# Patient Record
Sex: Female | Born: 1970 | Race: White | Hispanic: No | Marital: Married | Smoking: Never smoker
Health system: Southern US, Community
[De-identification: ages and names within clinical notes are randomized; demographics above are authoritative.]

## PROBLEM LIST (undated history)

## (undated) DIAGNOSIS — F329 Major depressive disorder, single episode, unspecified: Secondary | ICD-10-CM

## (undated) DIAGNOSIS — E039 Hypothyroidism, unspecified: Secondary | ICD-10-CM

## (undated) DIAGNOSIS — F32A Depression, unspecified: Secondary | ICD-10-CM

## (undated) DIAGNOSIS — O09529 Supervision of elderly multigravida, unspecified trimester: Secondary | ICD-10-CM

## (undated) DIAGNOSIS — Z8619 Personal history of other infectious and parasitic diseases: Secondary | ICD-10-CM

## (undated) HISTORY — DX: Depression, unspecified: F32.A

## (undated) HISTORY — DX: Personal history of other infectious and parasitic diseases: Z86.19

## (undated) HISTORY — PX: TONSILLECTOMY AND ADENOIDECTOMY: SUR1326

## (undated) HISTORY — DX: Supervision of elderly multigravida, unspecified trimester: O09.529

## (undated) HISTORY — DX: Major depressive disorder, single episode, unspecified: F32.9

## (undated) HISTORY — DX: Hypothyroidism, unspecified: E03.9

## (undated) HISTORY — PX: BREAST BIOPSY: SHX20

---

## 1997-09-29 ENCOUNTER — Inpatient Hospital Stay (HOSPITAL_COMMUNITY): Admission: AD | Admit: 1997-09-29 | Discharge: 1997-10-01 | Payer: Self-pay | Admitting: Obstetrics and Gynecology

## 1997-10-23 ENCOUNTER — Encounter: Admission: RE | Admit: 1997-10-23 | Discharge: 1998-01-21 | Payer: Self-pay | Admitting: *Deleted

## 1997-10-30 ENCOUNTER — Other Ambulatory Visit: Admission: RE | Admit: 1997-10-30 | Discharge: 1997-10-30 | Payer: Self-pay | Admitting: *Deleted

## 1998-11-04 ENCOUNTER — Other Ambulatory Visit: Admission: RE | Admit: 1998-11-04 | Discharge: 1998-11-04 | Payer: Self-pay | Admitting: *Deleted

## 1999-11-04 ENCOUNTER — Other Ambulatory Visit: Admission: RE | Admit: 1999-11-04 | Discharge: 1999-11-04 | Payer: Self-pay | Admitting: *Deleted

## 2000-11-14 ENCOUNTER — Other Ambulatory Visit: Admission: RE | Admit: 2000-11-14 | Discharge: 2000-11-14 | Payer: Self-pay | Admitting: *Deleted

## 2001-02-10 ENCOUNTER — Observation Stay (HOSPITAL_COMMUNITY): Admission: AD | Admit: 2001-02-10 | Discharge: 2001-02-11 | Payer: Self-pay | Admitting: *Deleted

## 2001-05-22 ENCOUNTER — Inpatient Hospital Stay: Admission: AD | Admit: 2001-05-22 | Discharge: 2001-05-22 | Payer: Self-pay | Admitting: Obstetrics and Gynecology

## 2001-05-25 ENCOUNTER — Inpatient Hospital Stay (HOSPITAL_COMMUNITY): Admission: AD | Admit: 2001-05-25 | Discharge: 2001-05-27 | Payer: Self-pay | Admitting: Obstetrics & Gynecology

## 2001-07-03 ENCOUNTER — Other Ambulatory Visit: Admission: RE | Admit: 2001-07-03 | Discharge: 2001-07-03 | Payer: Self-pay | Admitting: Obstetrics and Gynecology

## 2002-08-06 ENCOUNTER — Other Ambulatory Visit: Admission: RE | Admit: 2002-08-06 | Discharge: 2002-08-06 | Payer: Self-pay | Admitting: *Deleted

## 2003-09-17 ENCOUNTER — Other Ambulatory Visit: Admission: RE | Admit: 2003-09-17 | Discharge: 2003-09-17 | Payer: Self-pay | Admitting: *Deleted

## 2004-10-15 ENCOUNTER — Other Ambulatory Visit: Admission: RE | Admit: 2004-10-15 | Discharge: 2004-10-15 | Payer: Self-pay | Admitting: Obstetrics and Gynecology

## 2007-02-02 ENCOUNTER — Inpatient Hospital Stay (HOSPITAL_COMMUNITY): Admission: AD | Admit: 2007-02-02 | Discharge: 2007-02-02 | Payer: Self-pay | Admitting: Obstetrics and Gynecology

## 2007-02-10 ENCOUNTER — Inpatient Hospital Stay (HOSPITAL_COMMUNITY): Admission: RE | Admit: 2007-02-10 | Discharge: 2007-02-12 | Payer: Self-pay | Admitting: Obstetrics and Gynecology

## 2007-11-10 ENCOUNTER — Encounter: Payer: Self-pay | Admitting: Endocrinology

## 2007-11-29 ENCOUNTER — Ambulatory Visit: Payer: Self-pay | Admitting: Endocrinology

## 2007-11-29 DIAGNOSIS — E039 Hypothyroidism, unspecified: Secondary | ICD-10-CM | POA: Insufficient documentation

## 2007-12-28 ENCOUNTER — Encounter: Payer: Self-pay | Admitting: Endocrinology

## 2008-01-03 ENCOUNTER — Encounter: Payer: Self-pay | Admitting: Endocrinology

## 2008-02-16 ENCOUNTER — Ambulatory Visit: Payer: Self-pay | Admitting: Endocrinology

## 2008-11-28 ENCOUNTER — Ambulatory Visit: Payer: Self-pay | Admitting: Endocrinology

## 2008-12-27 ENCOUNTER — Telehealth: Payer: Self-pay | Admitting: Endocrinology

## 2009-02-27 ENCOUNTER — Telehealth: Payer: Self-pay | Admitting: Endocrinology

## 2010-03-12 ENCOUNTER — Inpatient Hospital Stay (HOSPITAL_COMMUNITY): Admission: RE | Admit: 2010-03-12 | Discharge: 2010-03-14 | Payer: Self-pay | Admitting: Obstetrics and Gynecology

## 2010-08-19 LAB — CBC
HCT: 29.5 % — ABNORMAL LOW (ref 36.0–46.0)
HCT: 35.3 % — ABNORMAL LOW (ref 36.0–46.0)
Hemoglobin: 10.1 g/dL — ABNORMAL LOW (ref 12.0–15.0)
MCH: 30.7 pg (ref 26.0–34.0)
MCHC: 34.2 g/dL (ref 30.0–36.0)
MCV: 89.7 fL (ref 78.0–100.0)
MCV: 89.9 fL (ref 78.0–100.0)
RBC: 3.28 MIL/uL — ABNORMAL LOW (ref 3.87–5.11)
RBC: 3.92 MIL/uL (ref 3.87–5.11)
WBC: 10.7 10*3/uL — ABNORMAL HIGH (ref 4.0–10.5)

## 2010-10-23 NOTE — Discharge Summary (Signed)
[facility]  Patient:    Megan Wagner, Megan Wagner Visit Number: [id number] MRN: [id number]          Service Type: OBS Location: [id number] Attending Physician:  Minette Headland Dictated by:   Freddy Finner, M.D. Admit Date:  02/10/2001 Discharge Date: 02/11/2001                             Discharge Summary  DISCHARGE DIAGNOSES:          1. Intrauterine pregnancy at [redacted] weeks gestation.                               2. Acute viral gastroenteritis with secondary                                  profound nausea and vomiting.  CONDITION ON DISCHARGE:       The patient is improved on the morning following admission after IV hydration, intravenous Zofran, and intravenous Reglan.  At this time she is tolerating a regular diet.  DISCHARGE INSTRUCTIONS:        She is to call for recurrence of symptoms.  She is to take adequate fluids.  FOLLOW-UP:                    She is to return to the office in approximately one week for follow-up.  HISTORY OF PRESENT ILLNESS:   For details, see that recorded in the admission note.  Briefly, the patient presented with sudden onset of nausea and vomiting, and this persisted in spite of antiemetics administered in the triage area.  PHYSICAL EXAMINATION:         Remarkable for the gravid state of the abdomen. No other physical abnormalities were noted.  LABORATORY DATA:              CBC with hemoglobin of 9.9, WBC of 12.0. Metabolic panel showed all parameters normal except albumin which was 2.8.  HOSPITAL COURSE:              Following admission the patient was continued on IV hydration and clear liquids only.  She was given Zofran every six hours and Reglan every six hours IV.  By the morning following her admission she had not had any emesis or feelings of nausea for approximately 12 hours.  She was able to tolerate a regular diet, and she was discharged home with disposition as noted above. Dictated by:   Freddy Finner, M.D. Attending Physician:  Minette Headland DD:  02/11/01 TD:  02/11/01 Job: [id number]

## 2011-03-19 LAB — CBC
HCT: 31.1 — ABNORMAL LOW
HCT: 34.1 — ABNORMAL LOW
Hemoglobin: 10.8 — ABNORMAL LOW
MCHC: 34.3
MCHC: 34.5
MCV: 88.1
Platelets: 220
Platelets: 229
RDW: 12.6
WBC: 11.6 — ABNORMAL HIGH

## 2011-06-08 NOTE — L&D Delivery Note (Signed)
Delivery Note  SVD viable female Apgars 9,9 over 1st degree lac.  Placenta delivered spontaneously intact with 3VC. Repair with 3-0 Chromic with good support and hemostasis noted and R/V exam confirms.  PH art was sent.  [place] cord blood was done.  Mother and baby were doing well.  EBL 300cc  [name], MD

## 2011-07-16 LAB — OB RESULTS CONSOLE HEPATITIS B SURFACE ANTIGEN: Hepatitis B Surface Ag: NEGATIVE

## 2011-07-16 LAB — OB RESULTS CONSOLE ABO/RH

## 2011-07-16 LAB — OB RESULTS CONSOLE ANTIBODY SCREEN: Antibody Screen: NEGATIVE

## 2011-07-16 LAB — OB RESULTS CONSOLE RUBELLA ANTIBODY, IGM: Rubella: IMMUNE

## 2011-07-16 LAB — OB RESULTS CONSOLE GC/CHLAMYDIA: Gonorrhea: NEGATIVE

## 2012-02-21 ENCOUNTER — Encounter (HOSPITAL_COMMUNITY): Payer: Self-pay | Admitting: *Deleted

## 2012-02-21 ENCOUNTER — Telehealth (HOSPITAL_COMMUNITY): Payer: Self-pay | Admitting: *Deleted

## 2012-02-21 NOTE — Telephone Encounter (Signed)
Preadmission screen  

## 2012-02-22 ENCOUNTER — Inpatient Hospital Stay (HOSPITAL_COMMUNITY): Payer: BC Managed Care – PPO | Admitting: Anesthesiology

## 2012-02-22 ENCOUNTER — Encounter (HOSPITAL_COMMUNITY): Payer: Self-pay

## 2012-02-22 ENCOUNTER — Encounter (HOSPITAL_COMMUNITY): Payer: Self-pay | Admitting: Anesthesiology

## 2012-02-22 ENCOUNTER — Inpatient Hospital Stay (HOSPITAL_COMMUNITY)
Admission: RE | Admit: 2012-02-22 | Discharge: 2012-02-24 | DRG: 373 | Disposition: A | Discharge status: Home / Self Care | Payer: BC Managed Care – PPO | Source: Ambulatory Visit | Attending: Obstetrics and Gynecology | Admitting: Obstetrics and Gynecology

## 2012-02-22 DIAGNOSIS — O09529 Supervision of elderly multigravida, unspecified trimester: Secondary | ICD-10-CM | POA: Diagnosis present

## 2012-02-22 DIAGNOSIS — E079 Disorder of thyroid, unspecified: Secondary | ICD-10-CM | POA: Diagnosis present

## 2012-02-22 DIAGNOSIS — Z22330 Carrier of Group B streptococcus: Secondary | ICD-10-CM

## 2012-02-22 DIAGNOSIS — O99284 Endocrine, nutritional and metabolic diseases complicating childbirth: Secondary | ICD-10-CM | POA: Diagnosis present

## 2012-02-22 DIAGNOSIS — E039 Hypothyroidism, unspecified: Secondary | ICD-10-CM | POA: Diagnosis present

## 2012-02-22 DIAGNOSIS — O99892 Other specified diseases and conditions complicating childbirth: Secondary | ICD-10-CM | POA: Diagnosis present

## 2012-02-22 LAB — CBC
MCH: 30.3 pg (ref 26.0–34.0)
MCHC: 33.7 g/dL (ref 30.0–36.0)
Platelets: 173 10*3/uL (ref 150–400)
RBC: 4.13 MIL/uL (ref 3.87–5.11)

## 2012-02-22 LAB — RPR: RPR Ser Ql: NONREACTIVE

## 2012-02-22 LAB — OB RESULTS CONSOLE GBS: GBS: NEGATIVE

## 2012-02-22 LAB — OB RESULTS CONSOLE GC/CHLAMYDIA: Gonorrhea: NEGATIVE

## 2012-02-22 MED ORDER — TERBUTALINE SULFATE 1 MG/ML IJ SOLN: 0.2500 mg | Freq: Once | INTRAMUSCULAR | Status: DC | PRN

## 2012-02-22 MED ORDER — PRENATAL MULTIVITAMIN CH
1.0000 | ORAL_TABLET | Freq: Every day | ORAL | Status: DC
  Administered 2012-02-23 – 2012-02-24 (×2): 1 via ORAL
  Filled 2012-02-22 (×2): qty 1

## 2012-02-22 MED ORDER — OXYCODONE-ACETAMINOPHEN 5-325 MG PO TABS: 1.0000 | ORAL_TABLET | ORAL | Status: DC | PRN

## 2012-02-22 MED ORDER — MEDROXYPROGESTERONE ACETATE 150 MG/ML IM SUSP: 150.0000 mg | INTRAMUSCULAR | Status: DC | PRN

## 2012-02-22 MED ORDER — ACETAMINOPHEN 325 MG PO TABS: 650.0000 mg | ORAL_TABLET | ORAL | Status: DC | PRN

## 2012-02-22 MED ORDER — LACTATED RINGERS IV SOLN
INTRAVENOUS | Status: DC
  Administered 2012-02-22: 15:00:00 via INTRAVENOUS

## 2012-02-22 MED ORDER — EPHEDRINE 5 MG/ML INJ
10.0000 mg | INTRAVENOUS | Status: DC | PRN
  Filled 2012-02-22: qty 2
  Filled 2012-02-22: qty 4

## 2012-02-22 MED ORDER — SODIUM CHLORIDE 0.9 % IJ SOLN: 3.0000 mL | INTRAMUSCULAR | Status: DC | PRN

## 2012-02-22 MED ORDER — DIPHENHYDRAMINE HCL 25 MG PO CAPS: 25.0000 mg | ORAL_CAPSULE | Freq: Four times a day (QID) | ORAL | Status: DC | PRN

## 2012-02-22 MED ORDER — ONDANSETRON HCL 4 MG PO TABS: 4.0000 mg | ORAL_TABLET | ORAL | Status: DC | PRN

## 2012-02-22 MED ORDER — LEVOTHYROXINE SODIUM 50 MCG PO TABS
50.0000 ug | ORAL_TABLET | Freq: Every day | ORAL | Status: DC
  Filled 2012-02-22 (×2): qty 1

## 2012-02-22 MED ORDER — PHENYLEPHRINE 40 MCG/ML (10ML) SYRINGE FOR IV PUSH (FOR BLOOD PRESSURE SUPPORT)
80.0000 ug | PREFILLED_SYRINGE | INTRAVENOUS | Status: DC | PRN
  Filled 2012-02-22: qty 2

## 2012-02-22 MED ORDER — IBUPROFEN 600 MG PO TABS: 600.0000 mg | ORAL_TABLET | Freq: Four times a day (QID) | ORAL | Status: DC | PRN

## 2012-02-22 MED ORDER — SIMETHICONE 80 MG PO CHEW: 80.0000 mg | CHEWABLE_TABLET | ORAL | Status: DC | PRN

## 2012-02-22 MED ORDER — WITCH HAZEL-GLYCERIN EX PADS: 1.0000 "application " | MEDICATED_PAD | CUTANEOUS | Status: DC | PRN

## 2012-02-22 MED ORDER — DIPHENHYDRAMINE HCL 50 MG/ML IJ SOLN: 12.5000 mg | INTRAMUSCULAR | Status: DC | PRN

## 2012-02-22 MED ORDER — CITRIC ACID-SODIUM CITRATE 334-500 MG/5ML PO SOLN
30.0000 mL | ORAL | Status: DC | PRN
  Administered 2012-02-22: 30 mL via ORAL
  Filled 2012-02-22: qty 15

## 2012-02-22 MED ORDER — ONDANSETRON HCL 4 MG/2ML IJ SOLN
4.0000 mg | Freq: Four times a day (QID) | INTRAMUSCULAR | Status: DC | PRN
  Administered 2012-02-22: 4 mg via INTRAVENOUS
  Filled 2012-02-22: qty 2

## 2012-02-22 MED ORDER — LACTATED RINGERS IV SOLN: 500.0000 mL | Freq: Once | INTRAVENOUS | Status: DC

## 2012-02-22 MED ORDER — SENNOSIDES-DOCUSATE SODIUM 8.6-50 MG PO TABS
2.0000 | ORAL_TABLET | Freq: Every day | ORAL | Status: DC
  Administered 2012-02-23: 2 via ORAL

## 2012-02-22 MED ORDER — SODIUM CHLORIDE 0.9 % IV SOLN
2.0000 g | Freq: Once | INTRAVENOUS | Status: AC
  Administered 2012-02-22: 2 g via INTRAVENOUS
  Filled 2012-02-22: qty 2000

## 2012-02-22 MED ORDER — IBUPROFEN 600 MG PO TABS
600.0000 mg | ORAL_TABLET | Freq: Four times a day (QID) | ORAL | Status: DC
  Administered 2012-02-23 – 2012-02-24 (×6): 600 mg via ORAL
  Filled 2012-02-22 (×6): qty 1

## 2012-02-22 MED ORDER — OXYTOCIN 40 UNITS IN LACTATED RINGERS INFUSION - SIMPLE MED
1.0000 m[IU]/min | INTRAVENOUS | Status: DC
  Administered 2012-02-22: 2 m[IU]/min via INTRAVENOUS

## 2012-02-22 MED ORDER — MEASLES, MUMPS & RUBELLA VAC ~~LOC~~ INJ
0.5000 mL | INJECTION | Freq: Once | SUBCUTANEOUS | Status: DC
  Filled 2012-02-22: qty 0.5

## 2012-02-22 MED ORDER — ZOLPIDEM TARTRATE 5 MG PO TABS: 5.0000 mg | ORAL_TABLET | Freq: Every evening | ORAL | Status: DC | PRN

## 2012-02-22 MED ORDER — BENZOCAINE-MENTHOL 20-0.5 % EX AERO
1.0000 "application " | INHALATION_SPRAY | CUTANEOUS | Status: DC | PRN
  Administered 2012-02-23: 1 via TOPICAL
  Filled 2012-02-22: qty 56

## 2012-02-22 MED ORDER — LANOLIN HYDROUS EX OINT: TOPICAL_OINTMENT | CUTANEOUS | Status: DC | PRN

## 2012-02-22 MED ORDER — LACTATED RINGERS IV SOLN: 500.0000 mL | INTRAVENOUS | Status: DC | PRN

## 2012-02-22 MED ORDER — ONDANSETRON HCL 4 MG/2ML IJ SOLN: 4.0000 mg | INTRAMUSCULAR | Status: DC | PRN

## 2012-02-22 MED ORDER — FENTANYL 2.5 MCG/ML BUPIVACAINE 1/10 % EPIDURAL INFUSION (WH - ANES)
INTRAMUSCULAR | Status: DC | PRN
  Administered 2012-02-22: 14 mL/h via EPIDURAL

## 2012-02-22 MED ORDER — EPHEDRINE 5 MG/ML INJ
10.0000 mg | INTRAVENOUS | Status: DC | PRN
  Filled 2012-02-22: qty 2

## 2012-02-22 MED ORDER — LIDOCAINE HCL (PF) 1 % IJ SOLN
30.0000 mL | INTRAMUSCULAR | Status: DC | PRN
  Filled 2012-02-22 (×2): qty 30

## 2012-02-22 MED ORDER — OXYTOCIN 40 UNITS IN LACTATED RINGERS INFUSION - SIMPLE MED
62.5000 mL/h | Freq: Once | INTRAVENOUS | Status: DC
  Filled 2012-02-22: qty 1000

## 2012-02-22 MED ORDER — FENTANYL 2.5 MCG/ML BUPIVACAINE 1/10 % EPIDURAL INFUSION (WH - ANES)
14.0000 mL/h | INTRAMUSCULAR | Status: DC
  Administered 2012-02-22: 14 mL/h via EPIDURAL
  Filled 2012-02-22 (×2): qty 60

## 2012-02-22 MED ORDER — SODIUM BICARBONATE 8.4 % IV SOLN
INTRAVENOUS | Status: DC | PRN
  Administered 2012-02-22: 4 mL via EPIDURAL

## 2012-02-22 MED ORDER — OXYTOCIN BOLUS FROM INFUSION
500.0000 mL | Freq: Once | INTRAVENOUS | Status: DC
  Filled 2012-02-22: qty 500

## 2012-02-22 MED ORDER — DIBUCAINE 1 % RE OINT: 1.0000 "application " | TOPICAL_OINTMENT | RECTAL | Status: DC | PRN

## 2012-02-22 MED ORDER — PHENYLEPHRINE 40 MCG/ML (10ML) SYRINGE FOR IV PUSH (FOR BLOOD PRESSURE SUPPORT)
80.0000 ug | PREFILLED_SYRINGE | INTRAVENOUS | Status: DC | PRN
  Filled 2012-02-22: qty 5
  Filled 2012-02-22: qty 2

## 2012-02-22 MED ORDER — SODIUM CHLORIDE 0.9 % IV SOLN
2.0000 g | Freq: Four times a day (QID) | INTRAVENOUS | Status: DC
  Administered 2012-02-22 (×2): 2 g via INTRAVENOUS
  Filled 2012-02-22 (×3): qty 2000

## 2012-02-22 MED ORDER — TETANUS-DIPHTH-ACELL PERTUSSIS 5-2.5-18.5 LF-MCG/0.5 IM SUSP: 0.5000 mL | Freq: Once | INTRAMUSCULAR | Status: DC

## 2012-02-22 NOTE — Anesthesia Procedure Notes (Signed)
Epidural Patient location during procedure: OB  Preanesthetic Checklist Completed: patient identified, site marked, surgical consent, pre-op evaluation, timeout performed, IV checked, risks and benefits discussed and monitors and equipment checked  Epidural Patient position: sitting Prep: site prepped and draped and DuraPrep Patient monitoring: continuous pulse ox and blood pressure Approach: midline Injection technique: LOR air  Needle:  Needle type: Tuohy  Needle gauge: 17 G Needle length: 9 cm and 9 Needle insertion depth: 5 cm cm Catheter type: closed end flexible Catheter size: 19 Gauge Catheter at skin depth: 10 cm Test dose: negative  Assessment Events: blood not aspirated, injection not painful, no injection resistance, negative IV test and no paresthesia  Additional Notes Dosing of Epidural:  1st dose, through needle ............................................. epi 1:200K + Xylocaine 40 mg  2nd dose, through catheter, after waiting 3 minutes.....epi 1:200K + Xylocaine 40 mg  3rd dose, through catheter after waiting 3 minutes .............................Marcaine   4mg   ( mg Marcaine are expressed as equivilent  cc's medication removed from the 0.1%Bupiv / fentanyl syringe from L&D pump)  ( 2% Xylo charted as a single dose in Epic Meds for ease of charting; actual dosing was fractionated as above, for saftey's sake)  As each dose occurred, patient was free of IV sx; and patient exhibited no evidence of SA injection.  Patient is more comfortable after epidural dosed. Please see RN's note for documentation of vital signs,and FHR which are stable.  Patient reminded not to try to ambulate with numb legs, and that an RN must be present the 1st time she attempts to get up.    

## 2012-02-22 NOTE — Anesthesia Preprocedure Evaluation (Signed)

## 2012-02-22 NOTE — H&P (Signed)
Megan Wagner is a 41 y.o. female presenting for induction of labor due to favorable cervix and GBS+. History OB History    Grav Para Term Preterm Abortions TAB SAB Ect Mult Living   5 4 4       4      Past Medical History  Diagnosis Date  . H/O varicella   . Hypothyroidism   . Depression     pp dep with 2nd pregnancy situational took zoloft  . AMA (advanced maternal age) multigravida 35+    Past Surgical History  Procedure Date  . Tonsillectomy and adenoidectomy    Family History: family history includes Birth defects in her cousins; Cancer in her father, paternal aunts, and paternal grandfather; Heart disease in her maternal grandfather and mother; and Osteoporosis in her maternal grandmother and mother. Social History:  reports that she has never smoked. She has never used smokeless tobacco. She reports that she does not drink alcohol or use illicit drugs.   Prenatal Transfer Tool  Maternal Diabetes: No Genetic Screening: Normal Maternal Ultrasounds/Referrals: Normal Fetal Ultrasounds or other Referrals:  None Maternal Substance Abuse:  No Significant Maternal Medications:  None Significant Maternal Lab Results:  None Other Comments:  None  ROS  Dilation: 2 Effacement (%): 70;80 Station: -2 Exam by:: Dr. Rana Snare Blood pressure 111/71, pulse 75, temperature 98.7 F (37.1 C), temperature source Oral, resp. rate 16, height 5\' 6"  (1.676 m), weight 71.668 kg (158 lb), last menstrual period 05/20/2011. Exam Physical Exam  Prenatal labs: ABO, Rh: O/Positive/-- (02/08 0000) Antibody: Negative (02/08 0000) Rubella: Immune (02/08 0000) RPR: Nonreactive (02/08 0000)  HBsAg: Negative (02/08 0000)  HIV: Non-reactive (02/08 0000)  GBS:     Assessment/Plan: IUP at term  Favorble cx and GBS+ Abx and AROM Anticipate SVD   Megan Wagner C 02/22/2012, 9:34 AM

## 2012-02-23 ENCOUNTER — Encounter (HOSPITAL_COMMUNITY): Payer: Self-pay

## 2012-02-23 LAB — CBC
Hemoglobin: 11.6 g/dL — ABNORMAL LOW (ref 12.0–15.0)
RBC: 3.82 MIL/uL — ABNORMAL LOW (ref 3.87–5.11)

## 2012-02-23 MED ORDER — INFLUENZA VIRUS VACC SPLIT PF IM SUSP
0.5000 mL | INTRAMUSCULAR | Status: AC
  Administered 2012-02-24: 0.5 mL via INTRAMUSCULAR
  Filled 2012-02-23: qty 0.5

## 2012-02-23 NOTE — Progress Notes (Signed)
Patient was referred for history of depression/anxiety. * Referral screened out by Clinical Social Worker because none of the following criteria appear to apply: ~ History of anxiety/depression during this pregnancy, or of post-partum depression. ~ Diagnosis of anxiety and/or depression within last 3 years ~ History of depression due to pregnancy loss/loss of child OR * Patient's symptoms currently being treated with medication and/or therapy. Please contact the Clinical Social Worker if needs arise, or if patient requests.  PNR states depression was situational with one past pregnancy and patient took Zoloft at the time.

## 2012-02-23 NOTE — Progress Notes (Signed)
Post Partum Day 1 Subjective: no complaints, up ad lib, voiding and tolerating PO  Objective: Blood pressure 91/50, pulse 55, temperature 98.1 F (36.7 C), temperature source Oral, resp. rate 16, height 5\' 6"  (1.676 m), weight 71.668 kg (158 lb), last menstrual period 05/20/2011, SpO2 100.00%, unknown if currently breastfeeding.  Physical Exam:  General: alert and cooperative Lochia: appropriate Uterine Fundus: firm Incision: perineum intact DVT Evaluation: No evidence of DVT seen on physical exam.   Basename 02/23/12 0515 02/22/12 0720  HGB 11.6* 12.5  HCT 34.6* 37.1    Assessment/Plan: Plan for discharge tomorrow   LOS: 1 day   Megan Wagner 02/23/2012, 8:08 AM

## 2012-02-24 ENCOUNTER — Inpatient Hospital Stay (HOSPITAL_COMMUNITY): Admission: AD | Admit: 2012-02-24 | Payer: Self-pay | Source: Ambulatory Visit | Admitting: Obstetrics and Gynecology

## 2012-02-24 MED ORDER — IBUPROFEN 600 MG PO TABS: 600.0000 mg | ORAL_TABLET | Freq: Four times a day (QID) | ORAL | Status: AC

## 2012-02-24 MED ORDER — INFLUENZA VIRUS VACC SPLIT PF IM SUSP: 0.5000 mL | INTRAMUSCULAR | Status: DC

## 2012-02-24 NOTE — Anesthesia Postprocedure Evaluation (Signed)
  Anesthesia Post-op Note  Patient: Megan Wagner  Procedure(s) Performed: * No procedures listed *  This patient has recovered from her labor epidural, and I am not aware of any complications or problems.

## 2012-02-24 NOTE — Discharge Summary (Signed)
Obstetric Discharge Summary Reason for Admission: induction of labor Prenatal Procedures: ultrasound Intrapartum Procedures: spontaneous vaginal delivery Postpartum Procedures: none Complications-Operative and Postpartum: 1 degree perineal laceration Hemoglobin  Date Value Range Status  02/23/2012 11.6* 12.0 - 15.0 g/dL Final     HCT  Date Value Range Status  02/23/2012 34.6* 36.0 - 46.0 % Final    Physical Exam:  General: alert and cooperative Lochia: appropriate Uterine Fundus: firm Incision: perineum intact DVT Evaluation: No evidence of DVT seen on physical exam.  Discharge Diagnoses: Term Pregnancy-delivered  Discharge Information: Date: 02/24/2012 Activity: pelvic rest Diet: routine Medications: PNV, Ibuprofen and synthroid Condition: stable Instructions: refer to practice specific booklet Discharge to: home   Newborn Data: Live born female  Birth Weight: 7 lb 4.6 oz (3306 g) APGAR: 9, 9  Home with mother.  Adylin Hankey G 02/24/2012, 8:07 AM

## 2012-03-29 ENCOUNTER — Other Ambulatory Visit: Payer: Self-pay | Admitting: Obstetrics and Gynecology

## 2012-03-29 DIAGNOSIS — N63 Unspecified lump in unspecified breast: Secondary | ICD-10-CM

## 2012-03-31 ENCOUNTER — Other Ambulatory Visit: Payer: BC Managed Care – PPO

## 2012-04-03 ENCOUNTER — Ambulatory Visit
Admission: RE | Admit: 2012-04-03 | Discharge: 2012-04-03 | Disposition: A | Discharge status: Home / Self Care | Payer: BC Managed Care – PPO | Source: Ambulatory Visit | Attending: Obstetrics and Gynecology | Admitting: Obstetrics and Gynecology

## 2012-04-03 DIAGNOSIS — N63 Unspecified lump in unspecified breast: Secondary | ICD-10-CM

## 2014-04-08 ENCOUNTER — Encounter (HOSPITAL_COMMUNITY): Payer: Self-pay

## 2014-08-15 ENCOUNTER — Institutional Professional Consult (permissible substitution): Payer: Self-pay | Admitting: Pulmonary Disease

## 2015-12-31 ENCOUNTER — Ambulatory Visit (INDEPENDENT_AMBULATORY_CARE_PROVIDER_SITE_OTHER): Payer: BLUE CROSS/BLUE SHIELD

## 2015-12-31 ENCOUNTER — Ambulatory Visit: Payer: Self-pay

## 2015-12-31 ENCOUNTER — Encounter: Payer: Self-pay | Admitting: Podiatry

## 2015-12-31 ENCOUNTER — Ambulatory Visit (INDEPENDENT_AMBULATORY_CARE_PROVIDER_SITE_OTHER): Payer: BLUE CROSS/BLUE SHIELD | Admitting: Podiatry

## 2015-12-31 VITALS — BP 120/79 | HR 61 | Resp 16 | Ht 65.0 in | Wt 140.0 lb

## 2015-12-31 DIAGNOSIS — M779 Enthesopathy, unspecified: Secondary | ICD-10-CM

## 2015-12-31 DIAGNOSIS — M79673 Pain in unspecified foot: Secondary | ICD-10-CM

## 2015-12-31 DIAGNOSIS — M2042 Other hammer toe(s) (acquired), left foot: Secondary | ICD-10-CM

## 2015-12-31 DIAGNOSIS — M21619 Bunion of unspecified foot: Secondary | ICD-10-CM

## 2015-12-31 MED ORDER — TRIAMCINOLONE ACETONIDE 10 MG/ML IJ SUSP
10.0000 mg | Freq: Once | INTRAMUSCULAR | Status: AC
  Administered 2015-12-31: 10 mg

## 2015-12-31 NOTE — Progress Notes (Signed)
   Subjective:    Patient ID: Megan Wagner, female    DOB: 05/05/1971, 45 y.o.   MRN: [id number]  HPI Chief Complaint  Patient presents with  . Foot Pain    Bilateral; plantar forefoot & dorsal (near toes); pt stated, "Ball of feet are killing them; feet feel bruised on the top of the foot"      Review of Systems  All other systems reviewed and are negative.      Objective:   Physical Exam        Assessment & Plan:

## 2016-01-01 NOTE — Progress Notes (Signed)
Subjective:     Patient ID: Megan Wagner, female   DOB: 01-09-1971, 45 y.o.   MRN: [id number]  HPI patient states that she's developed a lot of forefoot pain left over right over the last approximate 7 month. She states that she has increased her activity and has been walking more during that time and the pain seems to be worse and she had had a diagnosis of neuroma from a while ago   Review of Systems  All other systems reviewed and are negative.      Objective:   Physical Exam  Constitutional: She is oriented to person, place, and time.  Cardiovascular: Intact distal pulses.   Musculoskeletal: Normal range of motion.  Neurological: She is oriented to person, place, and time.  Skin: Skin is warm.  Nursing note and vitals reviewed.  neurovascular status found to be intact with muscle strength adequate range of motion within normal limits. Mild elevation of the lesser digits left over right with inflammation and pain around the second metatarsophalangeal joint left over right with also what appears to be palpable neuroma left foot. Patient is found to have good digital perfusion is well oriented and appears to have mild depression of the arch     Assessment:     Inflammatory changes with probable capsulitis second MPJ left over right with possibility also for neuroma symptomatology    Plan:     H&P x-rays reviewed and today I discussed did a proximal block of the left forefoot and discussed risk of injection which patient wants aspirated the second MPJ getting out of small amount of clear fluid and injected with a quarter cc of dexamethasone Kenalog. I then applied thick plantar pad to reduce stress on the joint and instructed on possible long-term orthotics or other treatment and reappoint for [place] to reevaluate in 2 weeks  X-ray report indicated that there was mild rotation of the second digit left over right but no indications of stress fracture arthritis

## 2016-01-14 ENCOUNTER — Ambulatory Visit: Payer: BLUE CROSS/BLUE SHIELD | Admitting: Podiatry

## 2016-01-21 ENCOUNTER — Encounter: Payer: Self-pay | Admitting: Podiatry

## 2016-01-21 ENCOUNTER — Ambulatory Visit (INDEPENDENT_AMBULATORY_CARE_PROVIDER_SITE_OTHER): Payer: BLUE CROSS/BLUE SHIELD | Admitting: Podiatry

## 2016-01-21 DIAGNOSIS — M779 Enthesopathy, unspecified: Secondary | ICD-10-CM | POA: Diagnosis not present

## 2016-01-21 DIAGNOSIS — M2042 Other hammer toe(s) (acquired), left foot: Secondary | ICD-10-CM | POA: Diagnosis not present

## 2016-01-21 DIAGNOSIS — M21619 Bunion of unspecified foot: Secondary | ICD-10-CM

## 2016-01-22 NOTE — Progress Notes (Signed)
Subjective:     Patient ID: Megan Wagner, female   DOB: [birth year], 45 y.o.   MRN: [id number]  HPI patient presents stating that her foot feels much better but she no she needs to get her bunions fixed right over left   Review of Systems     Objective:   Physical Exam Neurovascular status found to be intact muscle strength was adequate range of motion within normal limits. Patient's found to have structural bunion deformity right over left with redness and pain around the joint surface and also continued inflammatory changes around the second MPJ bilateral that are improved but present    Assessment:     Doing well with inflammatory condition still present but improved and patient has structural bunion deformity right over left    Plan:     H&P condition reviewed and discussed the correction of underlying deformity. She wants to have this done but needs to wait until November sore did review distal osteotomy and what would be required. Today I went ahead and I did scan her for a Berkley type orthotic with a deep heel cup 10 mm in order to provide control and take stress off the forefoot

## 2016-02-05 ENCOUNTER — Telehealth: Payer: Self-pay | Admitting: *Deleted

## 2016-02-05 NOTE — Telephone Encounter (Signed)
"  I'm calling to schedule 2 different surgeries, one on each foot with Dr. Paulla Dolly.  Please give me a call."

## 2016-02-12 NOTE — Telephone Encounter (Addendum)
"  I'm calling to schedule my surgery.  I've been calling like Dr. Paulla Dolly suggested and no one has returned my call.  "When would you like to schedule.  He suggested I do one foot the Tuesday before Thanksgiving and the other in December week of the 18.  We can schedule you for November 04/27/2016 and December 12.  Dr. Paulla Dolly will not be here the week of December 18.  "Okay that is fine.  Do you think that's enough time in between surgeries?"  Yes, it is.  You will need to schedule a consultation with Dr. Paulla Dolly.  I can tentatively put you down for that date.  Would you like for me to send you to a scheduler?  "Yes, thank you."

## 2016-02-12 NOTE — Telephone Encounter (Signed)
"  I want to try to schedule my surgery.  Could you give me a call?"  I'm a patient of Dr. Paulla Dolly."  I attempted to return her call.  I left a message on her mobile.  She was on a conference call and was not available at work.  I will call again later.

## 2016-02-27 ENCOUNTER — Ambulatory Visit (INDEPENDENT_AMBULATORY_CARE_PROVIDER_SITE_OTHER): Payer: BLUE CROSS/BLUE SHIELD | Admitting: Podiatry

## 2016-02-27 DIAGNOSIS — M779 Enthesopathy, unspecified: Secondary | ICD-10-CM

## 2016-02-27 NOTE — Patient Instructions (Signed)

## 2016-02-27 NOTE — Progress Notes (Signed)
Pt presents for orthotic ppick up, orthotics dispensed with wearing instructions, pt is to follow up in 4 weeks with any questions or concerns

## 2016-03-24 ENCOUNTER — Ambulatory Visit: Payer: BLUE CROSS/BLUE SHIELD | Admitting: Podiatry

## 2016-04-01 ENCOUNTER — Ambulatory Visit (INDEPENDENT_AMBULATORY_CARE_PROVIDER_SITE_OTHER): Payer: BLUE CROSS/BLUE SHIELD | Admitting: Podiatry

## 2016-04-01 DIAGNOSIS — M21619 Bunion of unspecified foot: Secondary | ICD-10-CM | POA: Diagnosis not present

## 2016-04-01 DIAGNOSIS — M779 Enthesopathy, unspecified: Secondary | ICD-10-CM

## 2016-04-01 NOTE — Patient Instructions (Signed)

## 2016-04-11 NOTE — Progress Notes (Signed)
Subjective:     Patient ID: Megan Wagner, female   DOB: 09-19-1970, 45 y.o.   MRN: [id number]  HPI patient presents with significant structural deformity left over right foot with hyperostosis and pain when palpated   Review of Systems     Objective:   Physical Exam Neurovascular status intact with structural changes around the first MPJ left with redness noted and pain when palpated    Assessment:     Structural bunion deformity left with pain and deformity with redness also noted around the joint surface    Plan:     H&P x-rays reviewed with the patient and discussed surgical intervention in this case. Patient wants surgery understanding all complications that are outlined in the consent form and the fact there is no long-term guarantees and at this point I allowed the patient to review the consent form treatment and all alternative treatments and complications as listed. Patient also understands recovery can take a proximally 6 months and today air fracture walker was dispensed with all instructions on usage

## 2016-04-27 ENCOUNTER — Encounter: Payer: Self-pay | Admitting: Podiatry

## 2016-04-27 DIAGNOSIS — M2012 Hallux valgus (acquired), left foot: Secondary | ICD-10-CM

## 2016-05-06 ENCOUNTER — Ambulatory Visit (INDEPENDENT_AMBULATORY_CARE_PROVIDER_SITE_OTHER): Payer: BLUE CROSS/BLUE SHIELD

## 2016-05-06 ENCOUNTER — Ambulatory Visit (INDEPENDENT_AMBULATORY_CARE_PROVIDER_SITE_OTHER): Payer: BLUE CROSS/BLUE SHIELD | Admitting: Podiatry

## 2016-05-06 VITALS — Temp 98.0°F

## 2016-05-06 DIAGNOSIS — Z9889 Other specified postprocedural states: Secondary | ICD-10-CM | POA: Diagnosis not present

## 2016-05-06 DIAGNOSIS — M21619 Bunion of unspecified foot: Secondary | ICD-10-CM

## 2016-05-06 NOTE — Progress Notes (Signed)
Subjective:     Patient ID: Megan Wagner, female   DOB: [birth year], 45 y.o.   MRN: [id number]  HPI patient presents stating she's doing well with minimal discomfort and swelling   Review of Systems     Objective:   Physical Exam Neurovascular status intact muscle strength adequate with hallux in a clinical good range of motion and good position with wound edges that are well coapted and no drainage or redness noted    Assessment:     Doing well post osteotomy first metatarsal left foot    Plan:     H&P x-rays reviewed and reapplied sterile dressing placing the toe in a more valgus position to take pressure off the joint surface. I explained to continue to do this and will be seen back in the next several weeks and is scheduled for her second foot in approximately 3 weeks  X-ray revealed slight varus component of the left but it is localized

## 2016-05-07 NOTE — Progress Notes (Signed)
DOS 11.21.2017 [place] Bunionectomy Left Foot with Pin Fixation

## 2016-05-14 ENCOUNTER — Encounter: Payer: Self-pay | Admitting: Podiatry

## 2016-05-18 ENCOUNTER — Encounter: Payer: Self-pay | Admitting: Podiatry

## 2016-05-18 DIAGNOSIS — M2011 Hallux valgus (acquired), right foot: Secondary | ICD-10-CM

## 2016-05-19 ENCOUNTER — Telehealth: Payer: Self-pay

## 2016-05-19 NOTE — Telephone Encounter (Signed)
LVM regarding post op status 

## 2016-05-20 NOTE — Progress Notes (Signed)
DOS 12.12.2017 [place] Bunionectomy (cutting and moving bone) Right with Pin

## 2016-05-26 ENCOUNTER — Ambulatory Visit (INDEPENDENT_AMBULATORY_CARE_PROVIDER_SITE_OTHER): Payer: BLUE CROSS/BLUE SHIELD

## 2016-05-26 ENCOUNTER — Ambulatory Visit (INDEPENDENT_AMBULATORY_CARE_PROVIDER_SITE_OTHER): Payer: BLUE CROSS/BLUE SHIELD | Admitting: Podiatry

## 2016-05-26 DIAGNOSIS — M21619 Bunion of unspecified foot: Secondary | ICD-10-CM

## 2016-05-26 DIAGNOSIS — Z9889 Other specified postprocedural states: Secondary | ICD-10-CM

## 2016-05-26 DIAGNOSIS — R600 Localized edema: Secondary | ICD-10-CM

## 2016-05-26 NOTE — Progress Notes (Signed)
Subjective: Patient presents today status post bunionectomy to the right foot. Date of surgery 05/18/2016. Surgery was performed by Dr. Paulla Dolly.  Objective: Incision site is well coapted with sutures intact. No sign of infectious process. Moderate edema and ecchymosis noted.  Assessment: Status post bunionectomy to the right foot. Date of surgery 05/18/2016.  Plan of care: Today dressings were changed. Compression anklet dispensed 2 for bilateral lower extremities. Patient had left foot bunionectomy surgery performed 3 weeks prior.  Return to clinic in 1 month

## 2016-07-01 ENCOUNTER — Telehealth: Payer: Self-pay | Admitting: *Deleted

## 2016-07-01 NOTE — Telephone Encounter (Signed)
Pt states she had both bunions surgeries in 2 months and one feels like she can feel the screw is close to the surface and hurts in a shoe. I told pt that is was rare but not unusual and she should make an appt to see Dr. Paulla Dolly and I transferred to scheduler.

## 2016-07-07 ENCOUNTER — Ambulatory Visit: Payer: BLUE CROSS/BLUE SHIELD

## 2016-07-07 ENCOUNTER — Ambulatory Visit (INDEPENDENT_AMBULATORY_CARE_PROVIDER_SITE_OTHER): Payer: Self-pay | Admitting: Podiatry

## 2016-07-07 ENCOUNTER — Ambulatory Visit (INDEPENDENT_AMBULATORY_CARE_PROVIDER_SITE_OTHER): Payer: BLUE CROSS/BLUE SHIELD

## 2016-07-07 DIAGNOSIS — Z9889 Other specified postprocedural states: Secondary | ICD-10-CM

## 2016-07-07 DIAGNOSIS — Z472 Encounter for removal of internal fixation device: Secondary | ICD-10-CM

## 2016-07-07 DIAGNOSIS — M21619 Bunion of unspecified foot: Secondary | ICD-10-CM | POA: Diagnosis not present

## 2016-07-07 NOTE — Progress Notes (Signed)
Subjective:     Patient ID: Megan Wagner, female   DOB: 18-Apr-1971, 47 y.o.   MRN: [id number]  HPI patient states the pin on the left foot is working its way out and it is getting irritated and she's having trouble wearing shoes and she's tried to pad the area   Review of Systems     Objective:   Physical Exam Neurovascular status intact muscle strength adequate with prominent pin position first metatarsal proximal shaft with pain with palpation    Assessment:     Probable movement of the pin secondary to trauma of the first metatarsal shaft    Plan:     Discussed condition and recommended removal of the pin and patient wants this done. I allowed her to read consent form going over alternative treatments complications she is willing to accept risk wants surgery and will be done in the office. I did apply padding to the area today to try to take pressure off it and she is scheduled for office pin removal

## 2016-07-29 ENCOUNTER — Encounter: Payer: Self-pay | Admitting: Podiatry

## 2016-07-29 ENCOUNTER — Encounter: Payer: BLUE CROSS/BLUE SHIELD | Admitting: *Deleted

## 2016-07-29 ENCOUNTER — Ambulatory Visit (INDEPENDENT_AMBULATORY_CARE_PROVIDER_SITE_OTHER): Payer: BLUE CROSS/BLUE SHIELD | Admitting: Podiatry

## 2016-07-29 DIAGNOSIS — Z472 Encounter for removal of internal fixation device: Secondary | ICD-10-CM | POA: Diagnosis not present

## 2016-07-29 NOTE — Progress Notes (Signed)
Office Surgery 02.22.2018 Removal of pin from left foot.

## 2016-07-29 NOTE — Progress Notes (Signed)
Subjective:     Patient ID: Megan Wagner, female   DOB: [birth year], 46 y.o.   MRN: [id number]  HPI patient presents with painful pin position left first metatarsal that she cannot wear shoe gear with   Review of Systems     Objective:   Physical Exam Neurovascular status intact with painful pin position first metatarsal left    Assessment:     Abnormal pin position left    Plan:     Patient was anesthetized with 60 mg Xylocaine Marcaine and after proper anesthesia she was taken to surgery where sterile prep was applied and tourniquet was inflated tourniquet 50 mmHg. I then went ahead and with sterile technique made an approximate 3 cm incision over the proximal shaft of the first metatarsal and took this through 16 he is tissue with hemostasis quite as necessary. I then went ahead and I made based cut and capsule and identified the abnormal pin and removed in toto flushed the wound and sutured with 5-0 nylon. Sterile dressing applied which was tolerated well and patient will be seen back to recheck

## 2016-08-11 ENCOUNTER — Other Ambulatory Visit: Payer: BLUE CROSS/BLUE SHIELD

## 2016-08-16 ENCOUNTER — Ambulatory Visit (INDEPENDENT_AMBULATORY_CARE_PROVIDER_SITE_OTHER): Payer: BLUE CROSS/BLUE SHIELD | Admitting: *Deleted

## 2016-08-16 DIAGNOSIS — Z9889 Other specified postprocedural states: Secondary | ICD-10-CM

## 2016-08-16 DIAGNOSIS — Z472 Encounter for removal of internal fixation device: Secondary | ICD-10-CM

## 2016-08-24 ENCOUNTER — Telehealth: Payer: Self-pay | Admitting: *Deleted

## 2016-08-24 NOTE — Telephone Encounter (Signed)
Pt states she had to cancel appt last Monday due to weather and took her stitches out and wanted to know if she needed to keep tomorrow's appt. Pt states the foot looks good. I told pt we would need to x-ray and she should re-appt, and transferred pt to schedulers.

## 2016-08-25 ENCOUNTER — Encounter: Payer: BLUE CROSS/BLUE SHIELD | Admitting: Podiatry

## 2016-08-26 ENCOUNTER — Encounter: Payer: BLUE CROSS/BLUE SHIELD | Admitting: Podiatry

## 2016-09-03 ENCOUNTER — Encounter: Payer: Self-pay | Admitting: Podiatry

## 2016-09-03 ENCOUNTER — Ambulatory Visit (INDEPENDENT_AMBULATORY_CARE_PROVIDER_SITE_OTHER): Payer: Self-pay | Admitting: Podiatry

## 2016-09-03 VITALS — BP 120/67 | HR 62 | Resp 16

## 2016-09-03 DIAGNOSIS — Z9889 Other specified postprocedural states: Secondary | ICD-10-CM

## 2016-09-03 NOTE — Progress Notes (Signed)
Subjective:     Patient ID: Megan Wagner, female   DOB: 08/11/1970, 46 y.o.   MRN: [id number]  HPI patient states she's doing very well   Review of Systems     Objective:   Physical Exam Incision site well coapted stitches in place with no drainage or gapping    Assessment:     Doing well post surgery    Plan:     Stitches are removed wound edges well coapted sterile dressing applied

## 2017-07-22 ENCOUNTER — Ambulatory Visit: Payer: BLUE CROSS/BLUE SHIELD | Admitting: Podiatry

## 2017-07-22 ENCOUNTER — Ambulatory Visit (INDEPENDENT_AMBULATORY_CARE_PROVIDER_SITE_OTHER): Payer: BLUE CROSS/BLUE SHIELD

## 2017-07-22 ENCOUNTER — Encounter: Payer: Self-pay | Admitting: Podiatry

## 2017-07-22 DIAGNOSIS — M779 Enthesopathy, unspecified: Secondary | ICD-10-CM | POA: Diagnosis not present

## 2017-07-22 DIAGNOSIS — Z9889 Other specified postprocedural states: Secondary | ICD-10-CM | POA: Diagnosis not present

## 2017-07-22 DIAGNOSIS — M21619 Bunion of unspecified foot: Secondary | ICD-10-CM

## 2017-07-22 MED ORDER — TRIAMCINOLONE ACETONIDE 10 MG/ML IJ SUSP
10.0000 mg | Freq: Once | INTRAMUSCULAR | Status: AC
  Administered 2017-07-22: 10 mg

## 2017-07-23 NOTE — Progress Notes (Signed)
Subjective:   Patient ID: Megan Wagner, female   DOB: 47 y.o.   MRN: [id number]   HPI Patient presents stating I been doing well but I developed a flareup in my left foot that makes it hard for me to walk neurovascular status   ROS      Objective:  Physical Exam  Neurovascular status intact with patient found to have acute inflammation of the fourth MPJ left with fluid buildup around the joint and pain with palpation     Assessment:  Acute inflammatory capsulitis fourth MPJ left     Plan:  H&P condition reviewed and today I did proximal nerve block left aspirated the fourth MPJ got a small amount of clear fluid and injected quarter cc dexamethasone Kenalog.  Applied padding and will see back if symptoms indicate  X-rays were negative for signs of fracture and indicated good structural correction of bunion deformity left

## 2017-09-30 NOTE — Progress Notes (Signed)
Entered in error

## 2018-06-09 ENCOUNTER — Ambulatory Visit: Payer: BLUE CROSS/BLUE SHIELD | Admitting: Podiatry

## 2018-06-09 ENCOUNTER — Other Ambulatory Visit: Payer: Self-pay | Admitting: Podiatry

## 2018-06-09 ENCOUNTER — Ambulatory Visit (INDEPENDENT_AMBULATORY_CARE_PROVIDER_SITE_OTHER): Payer: BLUE CROSS/BLUE SHIELD

## 2018-06-09 ENCOUNTER — Encounter: Payer: Self-pay | Admitting: Podiatry

## 2018-06-09 DIAGNOSIS — M779 Enthesopathy, unspecified: Secondary | ICD-10-CM | POA: Diagnosis not present

## 2018-06-09 DIAGNOSIS — M79672 Pain in left foot: Secondary | ICD-10-CM | POA: Diagnosis not present

## 2018-06-09 DIAGNOSIS — D3610 Benign neoplasm of peripheral nerves and autonomic nervous system, unspecified: Secondary | ICD-10-CM

## 2018-06-09 NOTE — Patient Instructions (Addendum)
Morton Neuralgia  Morton neuralgia is foot pain that affects the ball of the foot and the area near the toes. Morton neuralgia occurs when part of a nerve in the foot (digital nerve) is under too much pressure (compressed). When this happens over a long period of time, the nerve can thicken (neuroma) and cause pain. Pain usually occurs between the third and fourth toes.  Morton neuralgia can come and go but may get worse over time. What are the causes? This condition is caused by doing the same things over and over with your foot, such as:  Activities such as running or jumping.  Wearing shoes that are too tight. What increases the risk? You may be at higher risk for Morton neuralgia if you:  Are female.  Wear high heels.  Wear shoes that are narrow or tight.  Do activities that repeatedly stretch your toes, such as: ? Running. ? Ballet. ? Long-distance walking. What are the signs or symptoms? The first symptom of Morton neuralgia is pain that spreads from the ball of the foot to the toes. It may feel like you are walking on a marble. Pain usually gets worse with walking and goes away at night. Other symptoms may include numbness and cramping of your toes. Both feet are equally affected, but rarely at the same time. How is this diagnosed? This condition is diagnosed based on your symptoms, your medical history, and a physical exam. Your health care provider may:  Squeeze your foot just behind your toe.  Ask you to move your toes to check for pain.  Ask about your physical activity level. You also may have imaging tests, such as an X-ray, ultrasound, or MRI. How is this treated? Treatment depends on how severe your condition is and what causes it. Treatment may involve:  Wearing different shoes that are not too tight, are low-heeled, and provide good support. For some people, this is the only treatment needed.  Wearing an over-the-counter or custom supportive pad (orthotic)  under the front of your foot.  Getting injections of numbing medicine and anti-inflammatory medicine (steroid) in the nerve.  Having surgery to remove part of the thickened nerve. Follow these instructions at home: Managing pain, stiffness, and swelling   Massage your foot as needed.  Wear orthotics as told by your health care provider.  If directed, put ice on your foot: ? Put ice in a plastic bag. ? Place a towel between your skin and the bag. ? Leave the ice on for 20 minutes, 2-3 times a day.  Avoid activities that cause pain or make pain worse. If you play sports, ask your health care provider when it is safe for you to return to sports.  Raise (elevate) your foot above the level of your heart while lying down and, when possible, while sitting. General instructions  Take over-the-counter and prescription medicines only as told by your health care provider.  Do not drive or use heavy machinery while taking prescription pain medicine.  Wear shoes that: ? Have soft soles. ? Have a wide toe area. ? Provide arch support. ? Do not pinch or squeeze your feet. ? Have room for your orthotics, if applicable.  Keep all follow-up visits as told by your health care provider. This is important. Contact a health care provider if:  Your symptoms get worse or do not get better with treatment and home care. Summary  Morton neuralgia is foot pain that affects the ball of the foot and the   area near the toes. Pain usually occurs between the third and fourth toes, gets worse with walking, and goes away at night.  Morton neuralgia occurs when part of a nerve in the foot (digital nerve) is under too much pressure. When this happens over a long period of time, the nerve can thicken (neuroma) and cause pain.  This condition is caused by doing the same things over and over with your foot, such as running or jumping, wearing shoes that are too tight, or wearing high heels.  Treatment may  involve wearing low-heeled shoes that are not too tight, wearing a supportive pad (orthotic) under the front of your foot, getting injections in the nerve, or having surgery to remove part of the thickened nerve. This information is not intended to replace advice given to you by your health care provider. Make sure you discuss any questions you have with your health care provider. Document Released: 08/30/2000 Document Revised: 06/07/2017 Document Reviewed: 06/07/2017 Elsevier Interactive Patient Education  2019 [employer] Instructions  Congratulations, you have decided to take an important step towards improving your quality of life.  You can be assured that the doctors and staff at [facility] will be with you every step of the way.  Here are some important things you should know:  1. Plan to be at the surgery center/hospital at least 1 (one) hour prior to your scheduled time, unless otherwise directed by the surgical center/hospital staff.  You must have a responsible adult accompany you, remain during the surgery and drive you home.  Make sure you have directions to the surgical center/hospital to ensure you arrive on time. 2. If you are having surgery at [facility] or [facility], you will need a copy of your medical history and physical form from your family physician within one month prior to the date of surgery. We will give you a form for your primary physician to complete.  3. We make every effort to accommodate the date you request for surgery.  However, there are times where surgery dates or times have to be moved.  We will contact you as soon as possible if a change in schedule is required.   4. No aspirin/ibuprofen for one week before surgery.  If you are on aspirin, any non-steroidal anti-inflammatory medications (Mobic, Aleve, Ibuprofen) should not be taken seven (7) days prior to your surgery.  You make take Tylenol for pain prior to surgery.   5. Medications - If you are taking daily heart and blood pressure medications, seizure, reflux, allergy, asthma, anxiety, pain or diabetes medications, make sure you notify the surgery center/hospital before the day of surgery so they can tell you which medications you should take or avoid the day of surgery. 6. No food or drink after midnight the night before surgery unless directed otherwise by surgical center/hospital staff. 7. No alcoholic beverages 35-TDDUK prior to surgery.  No smoking 24-hours prior or 24-hours after surgery. 8. Wear loose pants or shorts. They should be loose enough to fit over bandages, boots, and casts. 9. Don't wear slip-on shoes. Sneakers are preferred. 10. Bring your boot with you to the surgery center/hospital.  Also bring crutches or a walker if your physician has prescribed it for you.  If you do not have this equipment, it will be provided for you after surgery. 11. If you have not been contacted by the surgery center/hospital by the day before your surgery, call to confirm the date and  time of your surgery. 12. Leave-time from work may vary depending on the type of surgery you have.  Appropriate arrangements should be made prior to surgery with your employer. 13. Prescriptions will be provided immediately following surgery by your doctor.  Fill these as soon as possible after surgery and take the medication as directed. Pain medications will not be refilled on weekends and must be approved by the doctor. 14. Remove nail polish on the operative foot and avoid getting pedicures prior to surgery. 15. Wash the night before surgery.  The night before surgery wash the foot and leg well with water and the antibacterial soap provided. Be sure to pay special attention to beneath the toenails and in between the toes.  Wash for at least three (3) minutes. Rinse thoroughly with water and dry well with a towel.  Perform this wash unless told not to do so by your  physician.  Enclosed: 1 Ice pack (please put in freezer the night before surgery)   1 Hibiclens skin cleaner   Pre-op instructions  If you have any questions regarding the instructions, please do not hesitate to call our office.  [place]: [address] [place], [place] [address] -- [place]: [address], [place], [place] [address] -- [phone]  [place]: [place], [place] [address] -- [phone]  [place]: [address] [place], [place], [place] [address] -- [phone]  Website: [email or web address]

## 2018-06-10 NOTE — Progress Notes (Signed)
Subjective:   Patient ID: Megan Wagner, female   DOB: 48 y.o.   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  At this point I have recommended neurectomy due to the intensity of discomfort long-term standing symptoms and failure to respond to previous injection wider shoes and other modalities.  She wants this treatment and I did discuss other possibilities but this is what she wants done.  At this point I allowed her to read consent form going over all possible complications and the fact there is no guarantee that this will get it better and alternative treatments.  Patient wants surgery signed consent form understanding recovery can take several months and there is no long-term guarantees of success.  Patient is scheduled for outpatient surgery and is encouraged to call with questions  X-rays were negative for signs of fracture or any changes to the metatarsal phalangeal joint

## 2018-06-13 ENCOUNTER — Encounter: Payer: Self-pay | Admitting: Podiatry

## 2018-06-13 DIAGNOSIS — G5762 Lesion of plantar nerve, left lower limb: Secondary | ICD-10-CM

## 2018-06-16 ENCOUNTER — Encounter: Payer: Self-pay | Admitting: Podiatry

## 2018-06-19 ENCOUNTER — Other Ambulatory Visit: Payer: BLUE CROSS/BLUE SHIELD

## 2018-06-21 ENCOUNTER — Ambulatory Visit (INDEPENDENT_AMBULATORY_CARE_PROVIDER_SITE_OTHER): Payer: BLUE CROSS/BLUE SHIELD | Admitting: Podiatry

## 2018-06-21 ENCOUNTER — Encounter: Payer: Self-pay | Admitting: Podiatry

## 2018-06-21 DIAGNOSIS — D3610 Benign neoplasm of peripheral nerves and autonomic nervous system, unspecified: Secondary | ICD-10-CM

## 2018-06-21 DIAGNOSIS — Z9889 Other specified postprocedural states: Secondary | ICD-10-CM

## 2018-06-21 DIAGNOSIS — G5762 Lesion of plantar nerve, left lower limb: Secondary | ICD-10-CM

## 2018-06-22 NOTE — Progress Notes (Signed)
Subjective:   Patient ID: Megan Wagner, female   DOB: 48 y.o.   MRN: [id number]   HPI Patient states that she is doing fantastic with no pain and states that she has had excellent recovery from surgery   ROS      Objective:  Physical Exam  Neurovascular status intact negative Homans sign noted wound edges well coapted third interspace left with no indications of pathology and no pain     Assessment:  Doing very well post neurectomy third interspace left with minimal discomfort     Plan:  Advised on the continuation of elevation compression immobilization with shoe and patient made gradual return to soft shoe gear and activities in the next 2 weeks.  Patient is encouraged to call with any questions and compression stocking was dispensed to patient at this time

## 2018-07-03 ENCOUNTER — Encounter: Payer: BLUE CROSS/BLUE SHIELD | Admitting: Podiatry

## 2018-10-24 NOTE — Progress Notes (Signed)
This encounter was created in error - please disregard.

## 2019-05-14 ENCOUNTER — Ambulatory Visit: Payer: BC Managed Care – PPO | Admitting: Podiatry

## 2019-05-14 ENCOUNTER — Encounter: Payer: Self-pay | Admitting: Podiatry

## 2019-05-14 ENCOUNTER — Other Ambulatory Visit: Payer: Self-pay

## 2019-05-14 ENCOUNTER — Ambulatory Visit (INDEPENDENT_AMBULATORY_CARE_PROVIDER_SITE_OTHER): Payer: BC Managed Care – PPO

## 2019-05-14 DIAGNOSIS — D3610 Benign neoplasm of peripheral nerves and autonomic nervous system, unspecified: Secondary | ICD-10-CM | POA: Diagnosis not present

## 2019-05-14 DIAGNOSIS — G5761 Lesion of plantar nerve, right lower limb: Secondary | ICD-10-CM | POA: Diagnosis not present

## 2019-05-14 NOTE — Progress Notes (Signed)
Subjective:   Patient ID: Megan Wagner, female   DOB: 48 y.o.   MRN: [id number]   HPI Patient presents stating that she is here because the neuroma on the right is starting to feel like the left that she is trying to catch a quicker   ROS      Objective:  Physical Exam  Neurovascular status intact with patient's right third interspace showing burning-like discomfort and shooting radiating-like pain     Assessment:  Neuroma symptomatology right presents after having had surgery left     Plan:  Reviewed x-ray and at this point organ to try conservative and I did sterile prep of the area and injected the nerve with a combination of purified alcohol steroid to try to create both inflammatory reduction and reduction of the nerve signal.  Patient tolerated the procedure well and will be seen back to recheck 2 weeks  X-rays indicated structure looks good pins in place joint congruence and no indications of stress fracture arthritis

## 2019-05-28 ENCOUNTER — Other Ambulatory Visit: Payer: Self-pay

## 2019-05-28 ENCOUNTER — Encounter: Payer: Self-pay | Admitting: Podiatry

## 2019-05-28 ENCOUNTER — Ambulatory Visit: Payer: BC Managed Care – PPO | Admitting: Podiatry

## 2019-05-28 DIAGNOSIS — G5751 Tarsal tunnel syndrome, right lower limb: Secondary | ICD-10-CM | POA: Diagnosis not present

## 2019-05-28 DIAGNOSIS — D3610 Benign neoplasm of peripheral nerves and autonomic nervous system, unspecified: Secondary | ICD-10-CM

## 2019-05-29 NOTE — Progress Notes (Signed)
Subjective:   Patient ID: Megan Wagner, female   DOB: 48 y.o.   MRN: [id number]   HPI Patient states that she is improved but still having some shooting pains in the right foot   ROS      Objective:  Physical Exam  Neurovascular status intact with patient's right foot tender in the third interspace with mild to moderate radiating-like discomforts     Assessment:  Neuroma-like symptomatology right     Plan:  Sterile prep went ahead and reinjected with a purified alcohol Marcaine solution into the third interspace and we discussed possibility for surgery if symptoms persist but hopefully this will reduce the symptomatology present and also used a small amount of steroid into the interspace

## 2019-12-03 ENCOUNTER — Other Ambulatory Visit: Payer: Self-pay | Admitting: Obstetrics and Gynecology

## 2019-12-03 DIAGNOSIS — R928 Other abnormal and inconclusive findings on diagnostic imaging of breast: Secondary | ICD-10-CM

## 2019-12-14 ENCOUNTER — Ambulatory Visit
Admission: RE | Admit: 2019-12-14 | Discharge: 2019-12-14 | Disposition: A | Discharge status: Home / Self Care | Payer: BC Managed Care – PPO | Source: Ambulatory Visit | Attending: Obstetrics and Gynecology | Admitting: Obstetrics and Gynecology

## 2019-12-14 ENCOUNTER — Other Ambulatory Visit: Payer: Self-pay | Admitting: Obstetrics and Gynecology

## 2019-12-14 ENCOUNTER — Other Ambulatory Visit: Payer: Self-pay

## 2019-12-14 DIAGNOSIS — R928 Other abnormal and inconclusive findings on diagnostic imaging of breast: Secondary | ICD-10-CM

## 2019-12-20 ENCOUNTER — Ambulatory Visit
Admission: RE | Admit: 2019-12-20 | Discharge: 2019-12-20 | Disposition: A | Discharge status: Home / Self Care | Payer: BC Managed Care – PPO | Source: Ambulatory Visit | Attending: Obstetrics and Gynecology | Admitting: Obstetrics and Gynecology

## 2019-12-20 ENCOUNTER — Other Ambulatory Visit: Payer: Self-pay

## 2019-12-20 ENCOUNTER — Other Ambulatory Visit: Payer: Self-pay | Admitting: Obstetrics and Gynecology

## 2019-12-20 DIAGNOSIS — R928 Other abnormal and inconclusive findings on diagnostic imaging of breast: Secondary | ICD-10-CM

## 2020-12-01 ENCOUNTER — Ambulatory Visit (INDEPENDENT_AMBULATORY_CARE_PROVIDER_SITE_OTHER): Payer: BC Managed Care – PPO

## 2020-12-01 ENCOUNTER — Other Ambulatory Visit: Payer: Self-pay

## 2020-12-01 ENCOUNTER — Encounter: Payer: Self-pay | Admitting: Podiatry

## 2020-12-01 ENCOUNTER — Ambulatory Visit: Payer: BC Managed Care – PPO | Admitting: Podiatry

## 2020-12-01 DIAGNOSIS — S92352A Displaced fracture of fifth metatarsal bone, left foot, initial encounter for closed fracture: Secondary | ICD-10-CM

## 2020-12-01 DIAGNOSIS — S99922A Unspecified injury of left foot, initial encounter: Secondary | ICD-10-CM

## 2020-12-01 NOTE — Progress Notes (Signed)
Subjective:   Patient ID: Megan Wagner, female   DOB: 50 y.o.   MRN: [id number]   HPI Patient states that today 3 hours ago she turned her left foot on the curb and felt a's searing pain and feels like she may have broken her foot.  Does not remember specific but does feel like it is broken   ROS      Objective:  Physical Exam  Neurovascular status intact significant swelling of the left lateral foot with pain around the base of the fifth metatarsal bone     Assessment:  Probability for fracture of the base of the fifth metatarsal left     Plan:  H&P x-rays reviewed condition discussed.  She does have a boot and she would home and she will start with these continue complete nonweightbearing and I did discuss possibly long-term this may require fixation.  She will be seen back 3 weeks to reevaluate with discussion about surgery today and will come in earlier if its not starting to improve by that point  X-rays indicate fracture of the base of the fifth metatarsal left with no indications of a Jones type fracture but there may be some slight displacement associated with

## 2020-12-22 ENCOUNTER — Ambulatory Visit (INDEPENDENT_AMBULATORY_CARE_PROVIDER_SITE_OTHER): Payer: BC Managed Care – PPO

## 2020-12-22 ENCOUNTER — Ambulatory Visit: Payer: BC Managed Care – PPO | Admitting: Podiatry

## 2020-12-22 ENCOUNTER — Encounter: Payer: Self-pay | Admitting: Podiatry

## 2020-12-22 ENCOUNTER — Other Ambulatory Visit: Payer: Self-pay

## 2020-12-22 DIAGNOSIS — S99922A Unspecified injury of left foot, initial encounter: Secondary | ICD-10-CM

## 2020-12-22 DIAGNOSIS — S92352A Displaced fracture of fifth metatarsal bone, left foot, initial encounter for closed fracture: Secondary | ICD-10-CM

## 2020-12-23 NOTE — Progress Notes (Signed)
Subjective:   Patient ID: Megan Wagner, female   DOB: 50 y.o.   MRN: [id number]   HPI Patient presents stating that she is feeling quite a bit better still wearing her boot and still having some swelling   ROS      Objective:  Physical Exam  Neurovascular status intact with patient noted to have inflammation pain around the base of the fifth metatarsal left that is improved but still painful with palpation     Assessment:  Fracture of the base of the fifth metatarsal left healing clinically with pain still noted but improving with boot usage still recurrent     Plan:  H&P reviewed x-rays and discussed there is some gapping base of fifth metatarsal but I am hopeful it will still heal uneventfully as it does not appear displaced with possibly 1 small fragment lateral.  This may require surgery at 1 point but at this time we will get a continue with immobilization and gradual weightbearing over the next 4 weeks and we will rex-ray and see where it stands at that time.  X-rays indicate there is a fracture base of fifth metatarsal left there is separation of the lateral side but I do think medial it is relatively intact and I am hopeful will heal with secondary intent in this area

## 2021-01-19 ENCOUNTER — Ambulatory Visit (INDEPENDENT_AMBULATORY_CARE_PROVIDER_SITE_OTHER): Payer: BC Managed Care – PPO

## 2021-01-19 ENCOUNTER — Other Ambulatory Visit: Payer: Self-pay

## 2021-01-19 ENCOUNTER — Ambulatory Visit: Payer: BC Managed Care – PPO | Admitting: Podiatry

## 2021-01-19 ENCOUNTER — Encounter: Payer: Self-pay | Admitting: Podiatry

## 2021-01-19 DIAGNOSIS — S92352A Displaced fracture of fifth metatarsal bone, left foot, initial encounter for closed fracture: Secondary | ICD-10-CM

## 2021-01-19 NOTE — Progress Notes (Signed)
Subjective:   Patient ID: Megan Wagner, female   DOB: 50 y.o.   MRN: [id number]   HPI Patient presents stating my foot is starting to feel a lot better still have mild swelling but much improved   ROS      Objective:  Physical Exam  Neurovascular status intact with swelling around the base of the fifth metatarsal left still present but improved from where it was previously with only mild discomfort     Assessment:  Doing much better post fracture base of fifth metatarsal left     Plan:  H&P reviewed condition recommended the continuation of ice anti-inflammatories and supportive shoes and patient will be seen back to recheck as needed but this should heal uneventfully  X-rays indicate that the healing is occurring there is still some healing to go but overall improving quite nicely

## 2021-08-29 IMAGING — MG MM BREAST BX W LOC DEV 1ST LESION IMAGE BX SPEC STEREO GUIDE*L*
7 of 16 series · 7 of 40 positions shown · non-contrast
Comparison: Previous exams.
COMPARISON: Previous exams.

Addendum:
CLINICAL DATA: 48-year-old female presenting for stereotactic
biopsy of a left breast asymmetry. The patient was also scheduled
for stereo biopsy of a distortion which was not performed today.

EXAM:
LEFT BREAST STEREOTACTIC CORE NEEDLE BIOPSY

[L (1 of 5)]
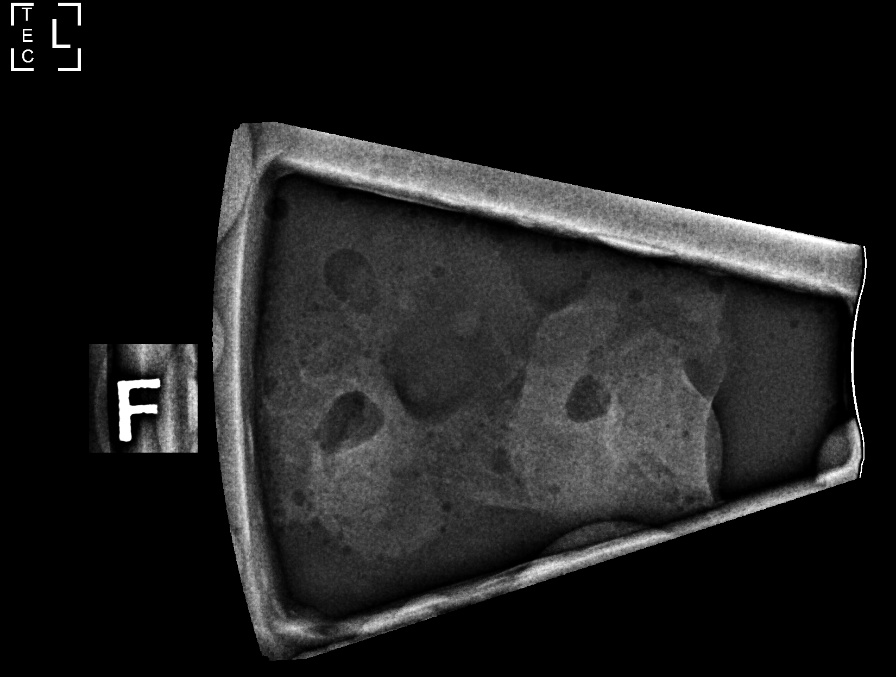

[L (2 of 5)]
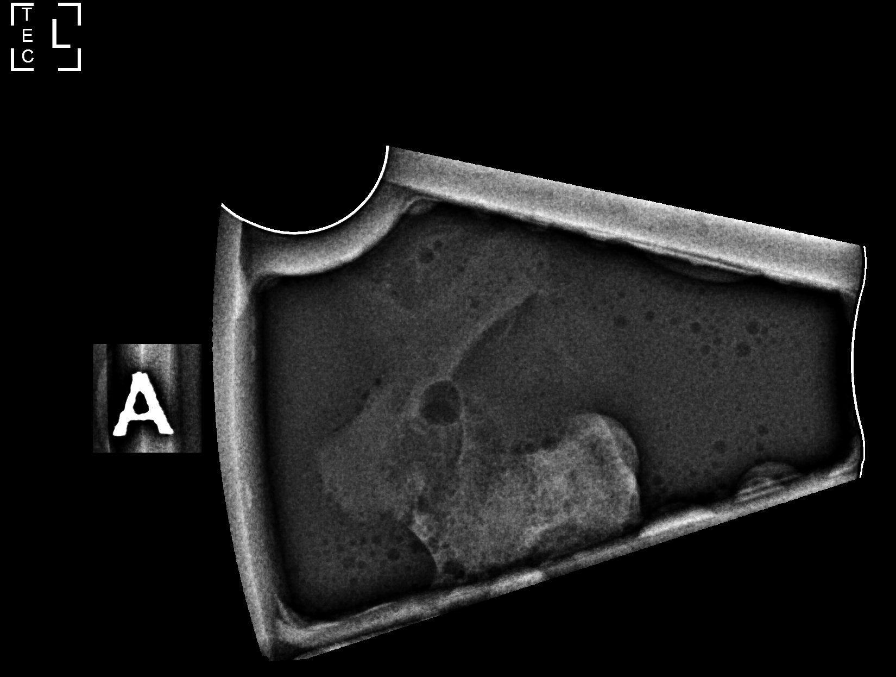

[L (3 of 5)]
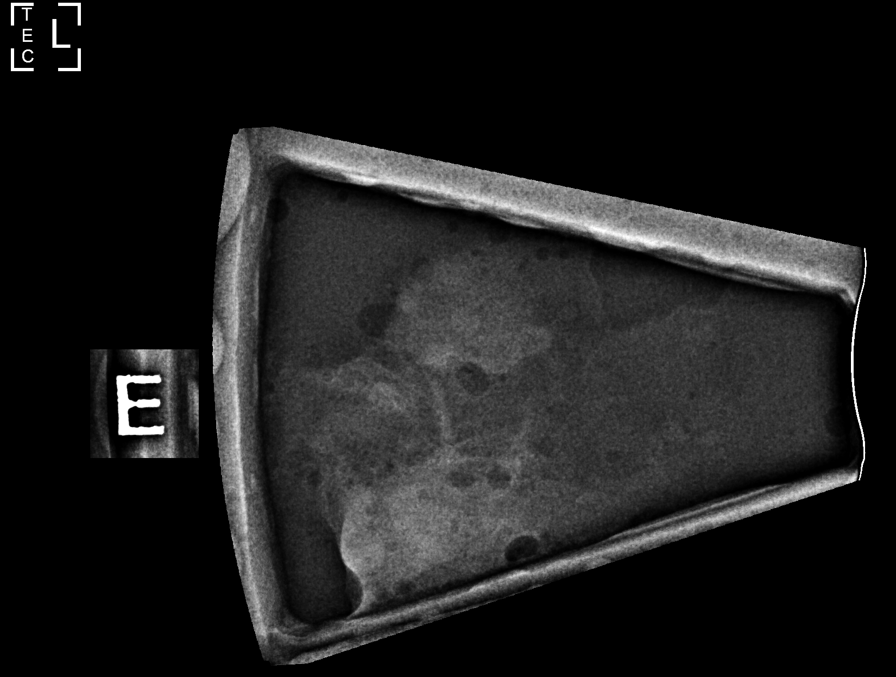

[L (4 of 5)]
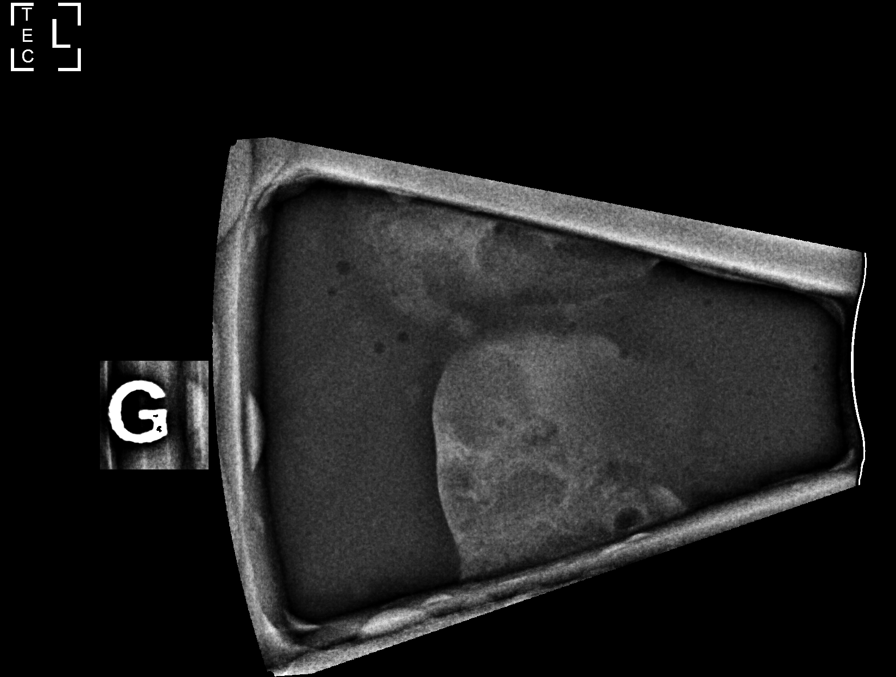

[L (5 of 5)]
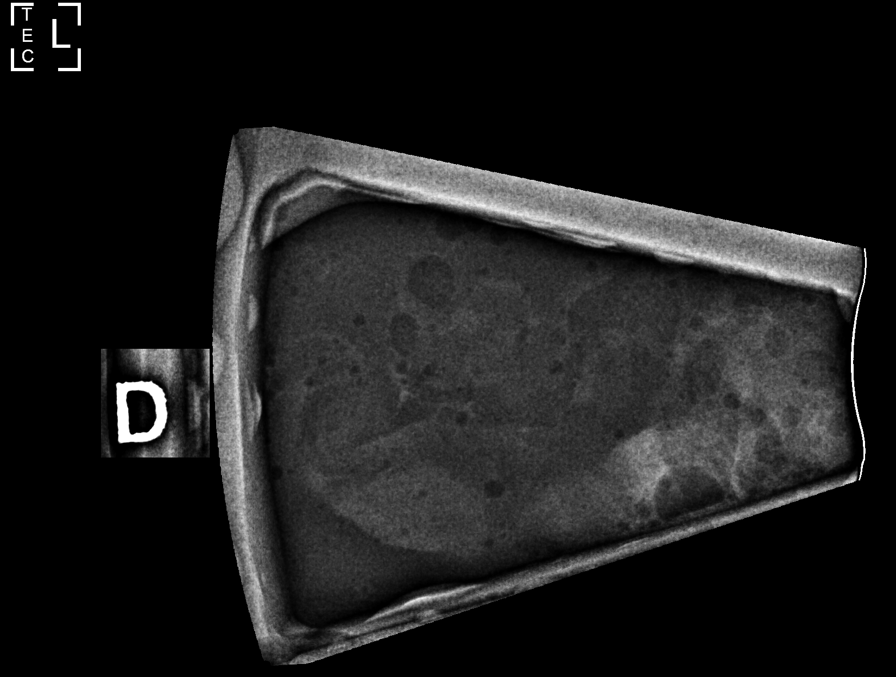

[L LM]
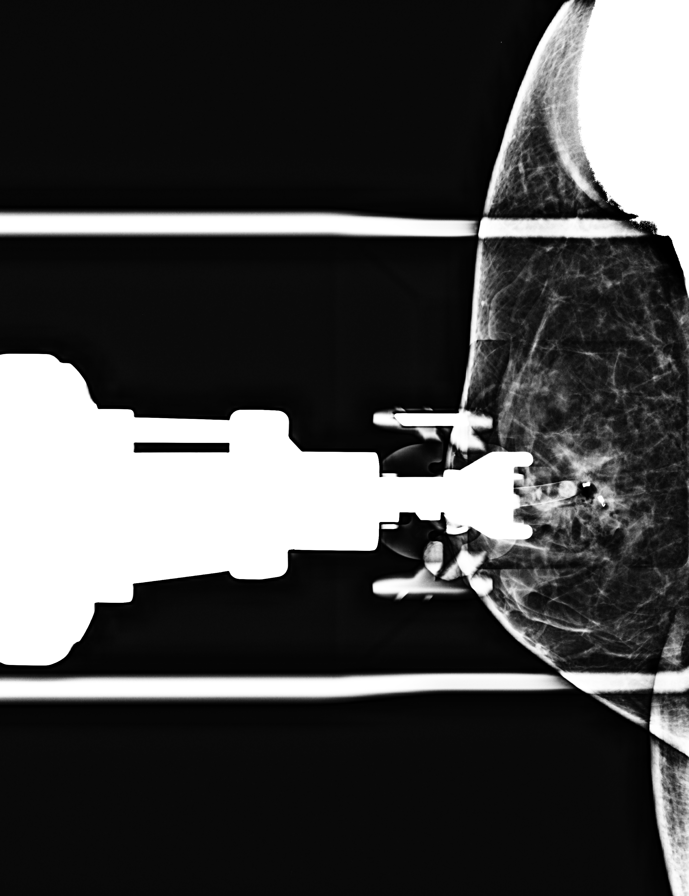

[L CC tomo · tomo slice 20/39.0]
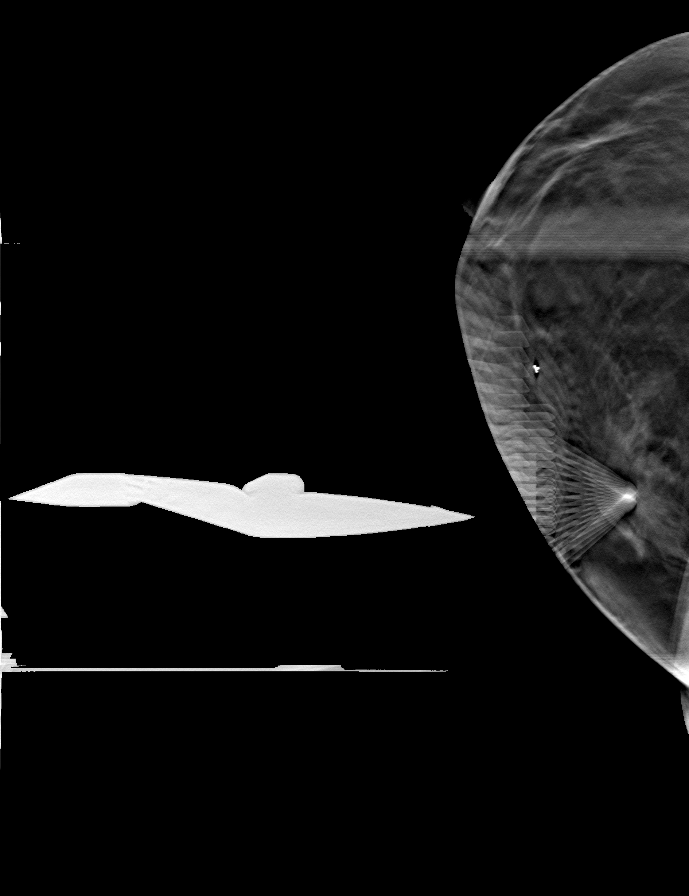

[7 of 40 positions shown; findings below may reference images not displayed]



The subtle distortion in the lateral posterior left breast was
initially targeted from a superior approach. Once the biopsy needle
was in place, the patient had a vasovagal reaction before tissue
samples could be obtained. The needle was removed in the patient was
placed in a supine position until she was able to proceed. We then
positioned her on her side to use an inferior. However, the
distortion which was already subtle was obscured by the previously
injected lidocaine. Therefore, the this biopsy was canceled and we
proceeded with the biopsy of the asymmetry.

Using sterile technique and 1% Lidocaine as local anesthetic, under
stereotactic guidance, a 9 gauge vacuum assisted device was used to
perform core needle biopsy of an asymmetry in the upper-outer left
breast using a lateral approach from a supine position.

Lesion quadrant: Upper outer quadrant

At the conclusion of the procedure, coil shaped tissue marker clip
was deployed into the biopsy cavity. Follow-up 2-view mammogram was
performed and dictated separately.
IMPRESSION: 1. Stereotactic-guided biopsy of an asymmetry in the upper-outer
left breast. No apparent complications.

2. The biopsy of the subtle distortion in the lateral left breast
was canceled, and will be rescheduled in a couple of weeks to allow
time for the biopsy changes to resolve.

ADDENDUM:
Pathology revealed FIBROCYSTIC CHANGE, PSEUDOANGIOMATOUS STROMAL
HYPERPLASIA of the Left breast, upper outer. This was found to be
concordant by Dr. Ferienhaus Erxleben.

Pathology results were discussed with the patient by telephone by
Sulema Cerritos, RN Nurse Navigator. The patient reported doing well
after the biopsy with tenderness at the site. Post biopsy
instructions and care were reviewed and questions were answered. The
patient was encouraged to call [REDACTED] for any additional concerns.

The patient is scheduled for a Left breast stereotatic guided biopsy
on January 03, 2020. Further recommendations will be guided by the
results of this biopsy.

Note: Since the patient is prone to vasovagal response, it would be
best for her to be lying down for the stereotatic biopsy.

Pathology results reported by Oddss Alueva, RN on 12/21/2019.



The subtle distortion in the lateral posterior left breast was
initially targeted from a superior approach. Once the biopsy needle
was in place, the patient had a vasovagal reaction before tissue
samples could be obtained. The needle was removed in the patient was
placed in a supine position until she was able to proceed. We then
positioned her on her side to use an inferior. However, the
distortion which was already subtle was obscured by the previously
injected lidocaine. Therefore, the this biopsy was canceled and we
proceeded with the biopsy of the asymmetry.

Using sterile technique and 1% Lidocaine as local anesthetic, under
stereotactic guidance, a 9 gauge vacuum assisted device was used to
perform core needle biopsy of an asymmetry in the upper-outer left
breast using a lateral approach from a supine position.

Lesion quadrant: Upper outer quadrant

At the conclusion of the procedure, coil shaped tissue marker clip
was deployed into the biopsy cavity. Follow-up 2-view mammogram was
performed and dictated separately.
IMPRESSION: 1. Stereotactic-guided biopsy of an asymmetry in the upper-outer
left breast. No apparent complications.

2. The biopsy of the subtle distortion in the lateral left breast
was canceled, and will be rescheduled in a couple of weeks to allow
time for the biopsy changes to resolve.

## 2023-07-07 ENCOUNTER — Ambulatory Visit (INDEPENDENT_AMBULATORY_CARE_PROVIDER_SITE_OTHER): Payer: BC Managed Care – PPO

## 2023-07-07 ENCOUNTER — Ambulatory Visit: Payer: BC Managed Care – PPO | Admitting: Podiatry

## 2023-07-07 ENCOUNTER — Encounter: Payer: Self-pay | Admitting: Podiatry

## 2023-07-07 DIAGNOSIS — D3610 Benign neoplasm of peripheral nerves and autonomic nervous system, unspecified: Secondary | ICD-10-CM

## 2023-07-07 DIAGNOSIS — M779 Enthesopathy, unspecified: Secondary | ICD-10-CM

## 2023-07-07 MED ORDER — TRIAMCINOLONE ACETONIDE 10 MG/ML IJ SUSP
10.0000 mg | Freq: Once | INTRAMUSCULAR | Status: AC
  Administered 2023-07-07: 10 mg via INTRA_ARTICULAR

## 2023-07-07 NOTE — Progress Notes (Signed)
Subjective:   Patient ID: Megan Wagner, female   DOB: 53 y.o.   MRN: [id number]   HPI Patient states she is doing a lot of running and she is developing shooting burning pain in her right foot like she had in her left foot   ROS      Objective:  Physical Exam  Neurovascular status intact with shooting pain third interspace right foot radiating discomfort into the adjacent digits     Assessment:  Neuroma symptomatology right with inflammation     Plan:  Sterile prep injected the third interspace with a combination of Xylocaine Marcaine and Kenalog and patient will be seen back to recheck

## 2024-02-09 ENCOUNTER — Telehealth: Payer: Self-pay | Admitting: Podiatry

## 2024-02-09 ENCOUNTER — Encounter: Payer: Self-pay | Admitting: Podiatry

## 2024-02-09 ENCOUNTER — Ambulatory Visit: Admitting: Podiatry

## 2024-02-09 DIAGNOSIS — D3610 Benign neoplasm of peripheral nerves and autonomic nervous system, unspecified: Secondary | ICD-10-CM | POA: Diagnosis not present

## 2024-02-09 NOTE — Progress Notes (Signed)
 Subjective:   Patient ID: Megan Wagner, female   DOB: 53 y.o.   MRN: [id number]   HPI Patient states her right foot has really been bothering her and the patient has developed increased shooting pains with having had a history of having the left 1 fixed that did great   ROS      Objective:  Physical Exam  Neurovascular status intact with exquisite discomfort third interspace right foot with radiating discomfort into the adjacent digits with a positive Mulder sign and mass felt between the 3rd and 4th toes with spread of the toes noted     Assessment:  Neuroma symptomatology right that has been injected has tried different shoe gear without relief     Plan:  H&P reviewed and I have recommended surgical resection of the nerve and I did explain procedure risk patient wants surgery.  I explained and allowed her to read consent form going over alternative treatments complications and she is willing to accept the risk of procedure and after extensive review signed consent form and is scheduled for outpatient surgery.  I encouraged her to call with questions concerns which may arise

## 2024-02-09 NOTE — Telephone Encounter (Signed)
 Received consent forms dated for surgery on 9/16  Left message for pt to call to confirm this day works for her to have the surgery.

## 2024-02-13 ENCOUNTER — Telehealth: Payer: Self-pay | Admitting: Podiatry

## 2024-02-13 NOTE — Telephone Encounter (Signed)
 DOS- 02/21/2024  3RD RT NEURECTOMY- 28080  [employer] EFFECTIVE DATE- 06/08/2019  DEDUCTIBLE- $700 REMAINING- $700 FAMILY DEDUCTIBLE- $1400 REMAINING- $1323.06 OOP- $3500 REMAINING- $3405 FAMILY OOP- $7000 REMAINING- $6143.06 COINSURANCE- 20%  PER [employer] WEBSITE, NO PRIOR AUTH IS REQUIRED FOR CPT CODE 71919. DOCUMENTATION ATTACHED TO SURGERY CONSENT PACKET.

## 2024-02-20 MED ORDER — HYDROCODONE-ACETAMINOPHEN 10-325 MG PO TABS: 1.0000 | ORAL_TABLET | Freq: Three times a day (TID) | ORAL | 0 refills | Status: AC | PRN

## 2024-02-20 NOTE — Addendum Note (Signed)
 Addended by: MAGDALEN PASCO RAMAN on: 02/20/2024 05:17 PM   Modules accepted: Orders

## 2024-02-21 DIAGNOSIS — G5761 Lesion of plantar nerve, right lower limb: Secondary | ICD-10-CM | POA: Diagnosis not present

## 2024-02-27 ENCOUNTER — Encounter: Payer: Self-pay | Admitting: Podiatry

## 2024-02-29 ENCOUNTER — Ambulatory Visit (INDEPENDENT_AMBULATORY_CARE_PROVIDER_SITE_OTHER): Admitting: Podiatry

## 2024-02-29 ENCOUNTER — Encounter: Payer: Self-pay | Admitting: Podiatry

## 2024-02-29 DIAGNOSIS — L02619 Cutaneous abscess of unspecified foot: Secondary | ICD-10-CM

## 2024-02-29 DIAGNOSIS — L03119 Cellulitis of unspecified part of limb: Secondary | ICD-10-CM

## 2024-02-29 MED ORDER — DOXYCYCLINE HYCLATE 100 MG PO TABS: 100.0000 mg | ORAL_TABLET | Freq: Two times a day (BID) | ORAL | 1 refills | Status: AC

## 2024-03-01 NOTE — Progress Notes (Signed)
 Subjective:   Patient ID: Megan Wagner, female   DOB: 53 y.o.   MRN: [id number]   HPI Patient admits that she has been on her foot a lot states overall she is doing well but it is slightly red   ROS      Objective:  Physical Exam  Vascular status intact negative Toula' sign noted with the patient's third interspace right healing well overall mild redness around the digit localized no drainage was noted     Assessment:  The patient has probably put some stress on the incision site with activity and may have a very mild low-grade localized infection process     Plan:  H&P reviewed discussed the possibility for low-grade infection and is precautionary measure placed her on doxycycline  twice daily for 10 days and I discussed the importance of elevation compression and continued immobilization with strict instructions if any further redness or any systemic signs of infection were to occur or drainage she is to call us  immediately or go to the emergency room.  All questions answered today

## 2024-03-12 ENCOUNTER — Ambulatory Visit: Admitting: Podiatry

## 2024-03-12 ENCOUNTER — Encounter: Payer: Self-pay | Admitting: Podiatry

## 2024-03-12 DIAGNOSIS — D3610 Benign neoplasm of peripheral nerves and autonomic nervous system, unspecified: Secondary | ICD-10-CM | POA: Diagnosis not present

## 2024-03-14 NOTE — Progress Notes (Signed)
 Subjective:   Patient ID: Megan Wagner, female   DOB: 53 y.o.   MRN: [id number]   HPI Patient states doing well   ROS      Objective:  Physical Exam  Neurovascular status intact negative T[place]a' sign noted third interspace right healing well wound edges well coapted slight redness no drainage     Assessment:  Overall doing much better with surgery with no current drainage noted     Plan:  Patient can now return to tennis shoes ankle compression stocking dispensed increase activity if any issues were to occur she is to contact us  right away

## 2024-05-21 ENCOUNTER — Other Ambulatory Visit: Payer: Self-pay | Admitting: Sports Medicine

## 2024-05-21 DIAGNOSIS — M84352A Stress fracture, left femur, initial encounter for fracture: Secondary | ICD-10-CM

## 2024-05-22 ENCOUNTER — Ambulatory Visit
Admission: RE | Admit: 2024-05-22 | Discharge: 2024-05-22 | Disposition: A | Discharge status: Home / Self Care | Source: Ambulatory Visit | Attending: Sports Medicine | Admitting: Sports Medicine

## 2024-05-22 DIAGNOSIS — M84352A Stress fracture, left femur, initial encounter for fracture: Secondary | ICD-10-CM
